# Patient Record
Sex: Male | Born: 1995 | Race: White | Hispanic: No | Marital: Single | Smoking: Current every day smoker
Health system: Southern US, Community
[De-identification: ages and names within clinical notes are randomized; demographics above are authoritative.]

## PROBLEM LIST (undated history)

## (undated) ENCOUNTER — Inpatient Hospital Stay: Admission: EM | Payer: Self-pay | Source: Home / Self Care

## (undated) DIAGNOSIS — M419 Scoliosis, unspecified: Secondary | ICD-10-CM

---
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
## 2016-04-10 ENCOUNTER — Emergency Department (HOSPITAL_COMMUNITY)
Admission: EM | Admit: 2016-04-10 | Discharge: 2016-04-10 | Disposition: A | Discharge status: Home / Self Care | Payer: Medicaid Other | Attending: Emergency Medicine | Admitting: Emergency Medicine

## 2016-04-10 ENCOUNTER — Encounter (HOSPITAL_COMMUNITY): Payer: Self-pay | Admitting: Emergency Medicine

## 2016-04-10 ENCOUNTER — Emergency Department (HOSPITAL_COMMUNITY): Payer: Medicaid Other

## 2016-04-10 DIAGNOSIS — F17210 Nicotine dependence, cigarettes, uncomplicated: Secondary | ICD-10-CM | POA: Diagnosis not present

## 2016-04-10 DIAGNOSIS — R110 Nausea: Secondary | ICD-10-CM | POA: Insufficient documentation

## 2016-04-10 DIAGNOSIS — R1032 Left lower quadrant pain: Secondary | ICD-10-CM | POA: Diagnosis present

## 2016-04-10 DIAGNOSIS — R109 Unspecified abdominal pain: Secondary | ICD-10-CM

## 2016-04-10 LAB — COMPREHENSIVE METABOLIC PANEL
ALBUMIN: 4.2 g/dL (ref 3.5–5.0)
ALK PHOS: 73 U/L (ref 38–126)
ALT: 31 U/L (ref 17–63)
AST: 22 U/L (ref 15–41)
Anion gap: 5 (ref 5–15)
BILIRUBIN TOTAL: 0.5 mg/dL (ref 0.3–1.2)
BUN: 11 mg/dL (ref 6–20)
CALCIUM: 9 mg/dL (ref 8.9–10.3)
CO2: 25 mmol/L (ref 22–32)
Chloride: 109 mmol/L (ref 101–111)
Creatinine, Ser: 0.77 mg/dL (ref 0.61–1.24)
GFR calc Af Amer: >60 mL/min (ref >60)
GFR calc non Af Amer: >60 mL/min (ref >60)
GLUCOSE: 108 mg/dL — AB (ref 65–99)
Potassium: 4.1 mmol/L (ref 3.5–5.1)
SODIUM: 139 mmol/L (ref 135–145)
TOTAL PROTEIN: 7.1 g/dL (ref 6.5–8.1)

## 2016-04-10 LAB — CBC
HCT: 39.2 % (ref 39.0–52.0)
HEMOGLOBIN: 13.3 g/dL (ref 13.0–17.0)
MCH: 27.6 pg (ref 26.0–34.0)
MCHC: 33.9 g/dL (ref 30.0–36.0)
MCV: 81.3 fL (ref 78.0–100.0)
Platelets: 249 10*3/uL (ref 150–400)
RBC: 4.82 MIL/uL (ref 4.22–5.81)
RDW: 12.5 % (ref 11.5–15.5)
WBC: 9.3 10*3/uL (ref 4.0–10.5)

## 2016-04-10 LAB — URINALYSIS, ROUTINE W REFLEX MICROSCOPIC
BILIRUBIN URINE: NEGATIVE
Glucose, UA: NEGATIVE mg/dL
KETONES UR: NEGATIVE mg/dL
Leukocytes, UA: NEGATIVE
NITRITE: NEGATIVE
PH: 6 (ref 5.0–8.0)
Protein, ur: NEGATIVE mg/dL

## 2016-04-10 LAB — URINE MICROSCOPIC-ADD ON
Bacteria, UA: NONE SEEN
SQUAMOUS EPITHELIAL / LPF: NONE SEEN
WBC UA: NONE SEEN WBC/hpf (ref 0–5)

## 2016-04-10 LAB — LIPASE, BLOOD: Lipase: 29 U/L (ref 11–51)

## 2016-04-10 MED ORDER — PANTOPRAZOLE SODIUM 20 MG PO TBEC: 20.0000 mg | DELAYED_RELEASE_TABLET | Freq: Every day | ORAL | Status: DC

## 2016-04-10 MED ORDER — RANITIDINE HCL 150 MG PO CAPS: 150.0000 mg | ORAL_CAPSULE | Freq: Two times a day (BID) | ORAL | Status: DC

## 2016-04-10 MED ORDER — IOPAMIDOL (ISOVUE-300) INJECTION 61%
100.0000 mL | Freq: Once | INTRAVENOUS | Status: AC | PRN
  Administered 2016-04-10: 100 mL via INTRAVENOUS

## 2016-04-10 NOTE — Discharge Instructions (Signed)
Your lab test are negative for acute problems. Your urine test suggests that there is mild dehydration present. Your CT scan is negative for any acute abdominal or pelvis problems. Your CT scan does suggest that she may have some early bulging of some disc in your lower back. Please discuss this with your primary physician. Please see Dr.Rourk, or a member of his team for GI evaluation of your lower abdomen pain. Please use Protonix and Zantac daily until seen by the GI specialist. Abdominal Pain, Adult Many things can cause abdominal pain. Usually, abdominal pain is not caused by a disease and will improve without treatment. It can often be observed and treated at home. Your health care provider will do a physical exam and possibly order blood tests and X-rays to help determine the seriousness of your pain. However, in many cases, more time must pass before a clear cause of the pain can be found. Before that point, your health care provider may not know if you need more testing or further treatment. HOME CARE INSTRUCTIONS Monitor your abdominal pain for any changes. The following actions may help to alleviate any discomfort you are experiencing:  Only take over-the-counter or prescription medicines as directed by your health care provider.  Do not take laxatives unless directed to do so by your health care provider.  Try a clear liquid diet (broth, tea, or water) as directed by your health care provider. Slowly move to a bland diet as tolerated. SEEK MEDICAL CARE IF:  You have unexplained abdominal pain.  You have abdominal pain associated with nausea or diarrhea.  You have pain when you urinate or have a bowel movement.  You experience abdominal pain that wakes you in the night.  You have abdominal pain that is worsened or improved by eating food.  You have abdominal pain that is worsened with eating fatty foods.  You have a fever. SEEK IMMEDIATE MEDICAL CARE IF:  Your pain does not go  away within 2 hours.  You keep throwing up (vomiting).  Your pain is felt only in portions of the abdomen, such as the right side or the left lower portion of the abdomen.  You pass bloody or black tarry stools. MAKE SURE YOU:  Understand these instructions.  Will watch your condition.  Will get help right away if you are not doing well or get worse.   This information is not intended to replace advice given to you by your health care provider. Make sure you discuss any questions you have with your health care provider.   Document Released: 06/27/2005 Document Revised: 06/08/2015 Document Reviewed: 05/27/2013 Elsevier Interactive Patient Education [employer]2016 [employer]

## 2016-04-10 NOTE — ED Notes (Signed)
When entered in room, found pt in cabinet, pt pulled an alcohol pad to clean site from lab draw, no dsg to site from lab draw as well

## 2016-04-10 NOTE — ED Notes (Signed)
Pt reports LLQ pain without n/v/d or urinary symptoms x 1 week.

## 2016-04-10 NOTE — ED Provider Notes (Signed)
CSN: [id number]     Arrival date & time 04/10/16  1311 History   First MD Initiated Contact with Patient 04/10/16 1448     Chief Complaint  Patient presents with  . Abdominal Pain     (Consider location/radiation/quality/duration/timing/severity/associated sxs/prior Treatment) Patient is a 20 y.o. male presenting with abdominal pain. The history is provided by the patient. No language interpreter was used.  Abdominal Pain Pain location:  LLQ Pain quality: aching   Pain radiates to:  Does not radiate Onset quality:  Gradual Duration:  1 week Timing:  Constant Progression:  Worsening Chronicity:  New Context: not recent travel   Relieved by:  Nothing Worsened by:  Nothing tried Ineffective treatments:  None tried Associated symptoms: nausea   Associated symptoms: no vomiting   Risk factors: no alcohol abuse and has not had multiple surgeries     Past Medical History  Diagnosis Date  . Scoliosis    History reviewed. No pertinent past surgical history. No family history on file. Social History  Substance Use Topics  . Smoking status: Current Every Day Smoker -- 0.50 packs/day    Types: Cigarettes  . Smokeless tobacco: None  . Alcohol Use: No    Review of Systems  Gastrointestinal: Positive for nausea and abdominal pain. Negative for vomiting.  All other systems reviewed and are negative.     Allergies  Review of patient's allergies indicates no known allergies.  Home Medications   Prior to Admission medications   Not on File   BP 136/73 mmHg  Pulse 93  Temp(Src) 98.1 F (36.7 C) (Oral)  Resp 16  Ht  (1.88 m)  Wt 113.399 kg  BMI 32.08 kg/m2  SpO2 99% Physical Exam  Constitutional: He is oriented to person, place, and time. He appears well-developed and well-nourished.  HENT:  Head: Normocephalic.  Eyes: EOM are normal.  Neck: Normal range of motion.  Cardiovascular: Normal rate and normal heart sounds.   Pulmonary/Chest: Effort normal.   Abdominal: He exhibits no distension.  Musculoskeletal: Normal range of motion.  Neurological: He is alert and oriented to person, place, and time.  Psychiatric: He has a normal mood and affect.  Nursing note and vitals reviewed.   ED Course  Procedures (including critical care time) Labs Review Labs Reviewed  COMPREHENSIVE METABOLIC PANEL - Abnormal; Notable for the following:    Glucose, Bld 108 (*)    All other components within normal limits  URINALYSIS, ROUTINE W REFLEX MICROSCOPIC (NOT AT [facility]) - Abnormal; Notable for the following:    Specific Gravity, Urine >1.030 (*)    Hgb urine dipstick TRACE (*)    All other components within normal limits  LIPASE, BLOOD  CBC  URINE MICROSCOPIC-ADD ON    Imaging Review Ct Abdomen Pelvis W Contrast  04/10/2016  CLINICAL DATA:  Left lower quadrant abdominal pain for 1 week. EXAM: CT ABDOMEN AND PELVIS WITH CONTRAST TECHNIQUE: Multidetector CT imaging of the abdomen and pelvis was performed using the standard protocol following bolus administration of intravenous contrast. CONTRAST:  ISOVUE-300 IOPAMIDOL (ISOVUE-300) INJECTION 61% COMPARISON:  None. FINDINGS: Lower chest: The lung bases are clear of acute process. No pleural effusion or pulmonary lesions. The heart is normal in size. No pericardial effusion. The distal esophagus and aorta are unremarkable. Hepatobiliary: No focal hepatic lesions or intrahepatic biliary dilatation. The gallbladder is normal. No common bile duct dilatation. Pancreas: No mass, inflammation or ductal dilatation. Spleen: Normal size.  No focal lesions. Adrenals/Urinary Tract: The  adrenal glands and kidneys are normal. No renal, ureteral or bladder calculi or mass. Stomach/Bowel: The stomach, duodenum, small bowel and colon are unremarkable. No inflammatory changes, mass lesions or obstructive findings. The terminal ileum is normal. The appendix is normal. Vascular/Lymphatic: The aorta is normal in caliber. The  branch vessels are normal. The major venous structures are patent. Small scattered mesenteric and retroperitoneal lymph nodes but no mass or overt adenopathy. Mesenteric adenitis is likely. Other: The bladder, prostate gland and seminal vesicles are unremarkable. No pelvic mass or adenopathy. No free pelvic fluid collections. No inguinal mass, adenopathy or hernia. Musculoskeletal: Thoracolumbar scoliosis. Bilateral pars defects at L5 with grade 1 spondylolisthesis. Moderate-sized bulging disc at L4-5. No acute bony findings. IMPRESSION: 1. No acute abdominal/pelvic findings, mass lesions or lymphadenopathy. 2. Numerous small mesenteric lymph nodes may suggest mesenteric adenitis. 3. Thoracolumbar scoliosis. 4. Bilateral pars defects at L5 with a grade 1 spondylolisthesis and diffuse bulging slightly uncovered disc at L4-5. Electronically Signed   By: Rudie MeyerP.  Gallerani M.D.   On: 04/10/2016 17:12   I have personally reviewed and evaluated these images and lab results as part of my medical decision-making.   EKG Interpretation None      MDM   Final diagnoses:  Abdominal pain, unspecified abdominal location    An After Visit Summary was printed and given to the patient.    Lonia SkinnerLeslie K ClontarfSofia, PA-C 04/11/16 1011  Lavera Guiseana Duo Liu, MD 04/11/16 [phone]

## 2016-04-10 NOTE — ED Provider Notes (Signed)
Patient care being continued from [name], P.A.-C.  Patient ambulatory from CT without problem.  Vital signs stable.  I reviewed the lab findings with the patient in terms which he understands. I reviewed the CT findings with the patient. The CT is negative for any acute abdomen changes. The CT does show some mild bulging disc at the L4-L5, and also some mild scoliosis. Patient's questions were answered. The patient will be treated with Protonix and Zantac for now. Patient referred to Dr.Rourk, or a member of his GI team. He will return to the emergency department sooner if any changes, problems, or concerns. Patient is in agreement with this discharge plan.  Stanley QualeHobson Sieanna Vanstone, PA-C 04/10/16 1741  Stanley HutchingBrian Cook, MD 04/11/16 [phone]

## 2016-12-02 ENCOUNTER — Encounter (HOSPITAL_COMMUNITY): Payer: Self-pay | Admitting: Emergency Medicine

## 2016-12-02 ENCOUNTER — Emergency Department (HOSPITAL_COMMUNITY)
Admission: EM | Admit: 2016-12-02 | Discharge: 2016-12-02 | Disposition: A | Discharge status: Home / Self Care | Payer: Medicaid Other | Attending: Emergency Medicine | Admitting: Emergency Medicine

## 2016-12-02 DIAGNOSIS — F17210 Nicotine dependence, cigarettes, uncomplicated: Secondary | ICD-10-CM | POA: Insufficient documentation

## 2016-12-02 DIAGNOSIS — Z79899 Other long term (current) drug therapy: Secondary | ICD-10-CM | POA: Insufficient documentation

## 2016-12-02 DIAGNOSIS — R21 Rash and other nonspecific skin eruption: Secondary | ICD-10-CM

## 2016-12-02 MED ORDER — PREDNISONE 50 MG PO TABS
60.0000 mg | ORAL_TABLET | Freq: Once | ORAL | Status: AC
  Administered 2016-12-02: 60 mg via ORAL
  Filled 2016-12-02: qty 1

## 2016-12-02 MED ORDER — DIPHENHYDRAMINE HCL 25 MG PO CAPS
25.0000 mg | ORAL_CAPSULE | Freq: Once | ORAL | Status: AC
  Administered 2016-12-02: 25 mg via ORAL
  Filled 2016-12-02: qty 1

## 2016-12-02 MED ORDER — TRIAMCINOLONE ACETONIDE 0.1 % EX CREA: 1.0000 "application " | TOPICAL_CREAM | Freq: Two times a day (BID) | CUTANEOUS | 0 refills | Status: DC

## 2016-12-02 MED ORDER — PREDNISONE 10 MG PO TABS
ORAL_TABLET | ORAL | 0 refills | Status: DC
Start: 2016-12-02 — End: 2017-09-26

## 2016-12-02 NOTE — ED Triage Notes (Signed)
Itchy rash in between fingers, arms legs and stomach x 3 weeks

## 2016-12-02 NOTE — ED Triage Notes (Signed)
Pt c/o rash to his hands, arms, chest, back and legs x 3 weeks.

## 2016-12-02 NOTE — Discharge Instructions (Signed)
Take your next dose of prednisone tomorrow evening.  Use benadryl as discussed if needed for itch relief.

## 2016-12-02 NOTE — ED Provider Notes (Signed)
[facility]-EMERGENCY DEPT Provider Note   CSN: [id number] Arrival date & time: 12/02/16  2007 By signing my name below, I, Levon Hedger, attest that this documentation has been prepared under the direction and in the presence of non-physician practitioner, Burgess Amor, PA-C. Electronically Signed: Levon Hedger, Scribe. 12/02/2016. 8:56 PM.   History   Chief Complaint Chief Complaint  Patient presents with  . Rash   HPI Stanley Sanchez is a 21 y.o. male with no pertinent PMHx who presents to the Emergency Department complaining of a gradually worsening, moderately itchy, diffuse rash to his bilateral hands, arms, legs and stomach onset three weeks ago. Per pt, the rash began on his stomach and has since spread to his upper and lower extremities. No alleviating or modifying factors noted. No OTC treatments tried PTA. No new soaps, lotions, detergents, foods, animals, plants, or medications. Per pt, no one at home has a similar rash. No hx of eczema. He denies any pain or any rash to scalp, neck or face.   PCP: Trinna Post, MD  The history is provided by the patient. No language interpreter was used.   Past Medical History:  Diagnosis Date  . Scoliosis    There are no active problems to display for this patient.  History reviewed. No pertinent surgical history.   Home Medications    Prior to Admission medications   Medication Sig Start Date End Date Taking? Authorizing Provider  pantoprazole (PROTONIX) 20 MG tablet Take 1 tablet (20 mg total) by mouth daily. 04/10/16   Ivery Quale, PA-C  predniSONE (DELTASONE) 10 MG tablet Take 6 tablets day one, 5 tablets day two, 4 tablets day three, 3 tablets day four, 2 tablets day five, then 1 tablet day six 12/02/16   Burgess Amor, PA-C  ranitidine (ZANTAC) 150 MG capsule Take 1 capsule (150 mg total) by mouth 2 (two) times daily. 04/10/16   Ivery Quale, PA-C  triamcinolone cream (KENALOG) 0.1 % Apply 1 application topically 2 (two) times  daily. Apply bid to rash on hands. 12/02/16   Burgess Amor, PA-C    Family History History reviewed. No pertinent family history.  Social History Social History  Substance Use Topics  . Smoking status: Current Every Day Smoker    Packs/day: 0.50    Types: Cigarettes  . Smokeless tobacco: Never Used  . Alcohol use No    Allergies   Patient has no known allergies.  Review of Systems Review of Systems  Constitutional: Negative for fever.  Musculoskeletal: Negative for myalgias.  Skin: Positive for rash.   Physical Exam Updated Vital Signs BP 126/71 (BP Location: Right Arm)   Pulse 98   Temp 98.8 F (37.1 C) (Oral)   Resp 18   Ht 6\' 2"  (1.88 m)   Wt 117.9 kg   SpO2 99%   BMI 33.38 kg/m   Physical Exam  Constitutional: He is oriented to person, place, and time. He appears well-developed and well-nourished. No distress.  HENT:  Head: Normocephalic and atraumatic.  Eyes: Conjunctivae are normal.  Cardiovascular: Normal rate.   Pulmonary/Chest: Effort normal.  Abdominal: He exhibits no distension.  Neurological: He is alert and oriented to person, place, and time.  Skin: Skin is warm and dry. Rash noted. Rash is papular.  Very small papules that are most confluent on the dorsal hands, more scattered on his forearms with one small patch on his abdomen. Surrounding skin is slightly hyperkeratotic. No erythema or drainage. No tunneling.  Psychiatric: He has  a normal mood and affect.  Nursing note and vitals reviewed.  ED Treatments / Results  DIAGNOSTIC STUDIES:  Oxygen Saturation is 99% on RA, normal by my interpretation.    COORDINATION OF CARE:  8:51 PM Discussed treatment plan which includes Prednisone with pt at bedside and pt agreed to plan.   Labs (all labs ordered are listed, but only abnormal results are displayed) Labs Reviewed - No data to display  EKG  EKG Interpretation None       Radiology No results found.  Procedures Procedures (including  critical care time)  Medications Ordered in ED Medications  predniSONE (DELTASONE) tablet 60 mg (60 mg Oral Given 12/02/16 2105)  diphenhydrAMINE (BENADRYL) capsule 25 mg (25 mg Oral Given 12/02/16 2105)     Initial Impression / Assessment and Plan / ED Course  I have reviewed the triage vital signs and the nursing notes.   Pertinent labs & imaging results that were available during my care of the patient were reviewed by me and considered in my medical decision making (see chart for details).     Nonspecific rash, but favoring contact dermatitis.  No tunneling, doubt scabies.  Prednisone, benadryl, f/u with pcp for a recheck if not improving over the next week.  Final Clinical Impressions(s) / ED Diagnoses   Final diagnoses:  Rash and nonspecific skin eruption    New Prescriptions New Prescriptions   PREDNISONE (DELTASONE) 10 MG TABLET    Take 6 tablets day one, 5 tablets day two, 4 tablets day three, 3 tablets day four, 2 tablets day five, then 1 tablet day six   TRIAMCINOLONE CREAM (KENALOG) 0.1 %    Apply 1 application topically 2 (two) times daily. Apply bid to rash on hands.  I personally performed the services described in this documentation, which was scribed in my presence. The recorded information has been reviewed and is accurate.    Burgess AmorJulie Nekayla Heider, PA-C 12/02/16 2112    Samuel JesterKathleen McManus, DO 12/04/16 1455

## 2017-09-26 ENCOUNTER — Emergency Department (HOSPITAL_COMMUNITY)
Admission: EM | Admit: 2017-09-26 | Discharge: 2017-09-26 | Disposition: A | Discharge status: Home / Self Care | Payer: BLUE CROSS/BLUE SHIELD | Attending: Emergency Medicine | Admitting: Emergency Medicine

## 2017-09-26 ENCOUNTER — Other Ambulatory Visit: Payer: Self-pay

## 2017-09-26 ENCOUNTER — Encounter (HOSPITAL_COMMUNITY): Payer: Self-pay

## 2017-09-26 DIAGNOSIS — F17210 Nicotine dependence, cigarettes, uncomplicated: Secondary | ICD-10-CM | POA: Diagnosis not present

## 2017-09-26 DIAGNOSIS — B2790 Infectious mononucleosis, unspecified without complication: Secondary | ICD-10-CM | POA: Diagnosis not present

## 2017-09-26 DIAGNOSIS — J029 Acute pharyngitis, unspecified: Secondary | ICD-10-CM | POA: Diagnosis not present

## 2017-09-26 LAB — I-STAT CHEM 8, ED
BUN: 11 mg/dL (ref 6–20)
CALCIUM ION: 1.07 mmol/L — AB (ref 1.15–1.40)
CHLORIDE: 99 mmol/L — AB (ref 101–111)
Creatinine, Ser: 1 mg/dL (ref 0.61–1.24)
Glucose, Bld: 121 mg/dL — ABNORMAL HIGH (ref 65–99)
HCT: 36 % — ABNORMAL LOW (ref 39.0–52.0)
Hemoglobin: 12.2 g/dL — ABNORMAL LOW (ref 13.0–17.0)
POTASSIUM: 4.2 mmol/L (ref 3.5–5.1)
SODIUM: 135 mmol/L (ref 135–145)
TCO2: 25 mmol/L (ref 22–32)

## 2017-09-26 LAB — RAPID STREP SCREEN (MED CTR MEBANE ONLY): Streptococcus, Group A Screen (Direct): NEGATIVE

## 2017-09-26 LAB — MONONUCLEOSIS SCREEN: Mono Screen: POSITIVE — AB

## 2017-09-26 MED ORDER — MAGIC MOUTHWASH W/LIDOCAINE: 5.0000 mL | Freq: Three times a day (TID) | ORAL | 0 refills | Status: AC | PRN

## 2017-09-26 MED ORDER — HYDROCODONE-ACETAMINOPHEN 7.5-325 MG/15ML PO SOLN: 10.0000 mL | Freq: Four times a day (QID) | ORAL | 0 refills | Status: AC | PRN

## 2017-09-26 MED ORDER — KETOROLAC TROMETHAMINE 60 MG/2ML IM SOLN
60.0000 mg | Freq: Once | INTRAMUSCULAR | Status: AC
  Administered 2017-09-26: 60 mg via INTRAMUSCULAR
  Filled 2017-09-26: qty 2

## 2017-09-26 MED ORDER — HYDROCODONE-ACETAMINOPHEN 7.5-325 MG/15ML PO SOLN
10.0000 mL | Freq: Once | ORAL | Status: AC
  Administered 2017-09-26: 10 mL via ORAL
  Filled 2017-09-26: qty 15

## 2017-09-26 MED ORDER — IBUPROFEN 100 MG/5ML PO SUSP: 600.0000 mg | Freq: Four times a day (QID) | ORAL | 0 refills | Status: AC | PRN

## 2017-09-26 NOTE — ED Notes (Signed)
Patient given discharge instruction, verbalized understand. Patient ambulatory out of the department.  

## 2017-09-26 NOTE — Discharge Instructions (Signed)
It is very important that you drink plenty of fluids.  Soft foods as tolerated.  Avoid letting anyone eat or drink after you as this is contagious.  You can contact 1 of the providers listed to arrange follow-up appointment in 1-2 weeks.  Return to the ER for any worsening symptoms.  Avoid any strenuous activity or physical contact that could possibly injury your spleen for at least 2-3 weeks.

## 2017-09-26 NOTE — ED Triage Notes (Signed)
Reports of sore throat x3 days. Denies fever.

## 2017-09-27 NOTE — ED Provider Notes (Signed)
[facility] EMERGENCY DEPARTMENT Provider Note   CSN: [id number] Arrival date & time: 09/26/17  [id number]     History   Chief Complaint Chief Complaint  Patient presents with  . Sore Throat    HPI Luana ShuJonathan D Grecco is a 21 y.o. male.  HPI  Luana ShuJonathan D Favero is a 21 y.o. male who presents to the Emergency Department complaining of sore throat and pain with swallowing.  Symptoms have been present for 3 days.  States he has been unable to eat solid food or drink liquids due to throat pain.  Denies fever, but symptoms are associated with generalized malaise and body aches.  No known sick contacts.  He denies significant nasal congestion, cough, shortness of breath, abdominal pain, rash and vomiting.  He has been taking OTC pain relievers without improvement.    Past Medical History:  Diagnosis Date  . Scoliosis     There are no active problems to display for this patient.   History reviewed. No pertinent surgical history.     Home Medications    Prior to Admission medications   Medication Sig Start Date End Date Taking? Authorizing Provider  HYDROcodone-acetaminophen (HYCET) 7.5-325 mg/15 ml solution Take 10 mLs by mouth every 6 (six) hours as needed for moderate pain. 09/26/17   Estephan Gallardo, PA-C  ibuprofen (ADVIL,MOTRIN) 100 MG/5ML suspension Take 30 mLs (600 mg total) by mouth every 6 (six) hours as needed. 09/26/17   Tarrance Januszewski, PA-C  magic mouthwash w/lidocaine SOLN Take 5 mLs by mouth 3 (three) times daily as needed for mouth pain. Swish and spit, do not swallow 09/26/17   Bonne Whack, PA-C    Family History No family history on file.  Social History Social History   Tobacco Use  . Smoking status: Current Every Day Smoker    Packs/day: 0.50    Types: Cigarettes  . Smokeless tobacco: Never Used  Substance Use Topics  . Alcohol use: No  . Drug use: No     Allergies   Patient has no known allergies.   Review of Systems Review of Systems    Constitutional: Positive for fatigue. Negative for activity change, appetite change, chills and fever.  HENT: Positive for sore throat and trouble swallowing. Negative for congestion, ear pain, facial swelling and voice change.   Eyes: Negative for pain and visual disturbance.  Respiratory: Negative for cough and shortness of breath.   Gastrointestinal: Negative for abdominal pain, nausea and vomiting.  Genitourinary: Negative for dysuria and flank pain.  Musculoskeletal: Positive for myalgias. Negative for arthralgias, neck pain and neck stiffness.  Skin: Negative for color change and rash.  Neurological: Negative for dizziness, facial asymmetry, speech difficulty, numbness and headaches.  Hematological: Negative for adenopathy.  All other systems reviewed and are negative.    Physical Exam Updated Vital Signs BP 136/68   Pulse 60   Temp 98.1 F (36.7 C) (Oral)   Resp 17   Ht 6\' 2"  (1.88 m)   Wt 113.4 kg (250 lb)   SpO2 (!) 87%   BMI 32.10 kg/m   Physical Exam  Constitutional: He is oriented to person, place, and time. He appears well-developed and well-nourished. No distress.  HENT:  Head: Normocephalic and atraumatic.  Right Ear: Tympanic membrane and ear canal normal.  Left Ear: Tympanic membrane and ear canal normal.  Mouth/Throat: Uvula is midline and mucous membranes are normal. No trismus in the jaw. No uvula swelling. Posterior oropharyngeal edema and posterior oropharyngeal erythema present. No  tonsillar abscesses. Tonsils are 2+ on the right. Tonsils are 2+ on the left. Tonsillar exudate.  Neck: Normal range of motion. Neck supple.  Cardiovascular: Normal rate, regular rhythm and normal heart sounds.  No murmur heard. Pulmonary/Chest: Effort normal and breath sounds normal. No respiratory distress.  Abdominal: Soft. Normal appearance. He exhibits no distension. There is no splenomegaly. There is no tenderness.  Musculoskeletal: Normal range of motion.   Lymphadenopathy:    He has no cervical adenopathy.  Neurological: He is alert and oriented to person, place, and time. He exhibits normal muscle tone. Coordination normal.  Skin: Skin is warm and dry.  Nursing note and vitals reviewed.    ED Treatments / Results  Labs (all labs ordered are listed, but only abnormal results are displayed) Labs Reviewed  MONONUCLEOSIS SCREEN - Abnormal; Notable for the following components:      Result Value   Mono Screen POSITIVE (*)    All other components within normal limits  I-STAT CHEM 8, ED - Abnormal; Notable for the following components:   Chloride 99 (*)    Glucose, Bld 121 (*)    Calcium, Ion 1.07 (*)    Hemoglobin 12.2 (*)    HCT 36.0 (*)    All other components within normal limits  RAPID STREP SCREEN (NOT AT [facility])  CULTURE, GROUP A STREP [facility]    EKG  EKG Interpretation None       Radiology No results found.  Procedures Procedures (including critical care time)  Medications Ordered in ED Medications  HYDROcodone-acetaminophen (HYCET) 7.5-325 mg/15 ml solution 10 mL (10 mLs Oral Given 09/26/17 0916)  ketorolac (TORADOL) injection 60 mg (60 mg Intramuscular Given 09/26/17 1056)     Initial Impression / Assessment and Plan / ED Course  I have reviewed the triage vital signs and the nursing notes.  Pertinent labs & imaging results that were available during my care of the patient were reviewed by me and considered in my medical decision making (see chart for details).     Airway patent.  Bilateral tonsillar edema.  No concerning sx's for PTA. No clinical signs of dehydration.  Reports feeling better after medications.   Pt tolerating oral fluids.  Positive mono spot.  Pt counseled on activity precautions.  Agrees to supportive care and close PCP f/u.    Final Clinical Impressions(s) / ED Diagnoses   Final diagnoses:  Mononucleosis    ED Discharge Orders        Ordered    ibuprofen (ADVIL,MOTRIN) 100 MG/5ML  suspension  Every 6 hours PRN     09/26/17 1128    magic mouthwash w/lidocaine SOLN  3 times daily PRN     09/26/17 1128    HYDROcodone-acetaminophen (HYCET) 7.5-325 mg/15 ml solution  Every 6 hours PRN     09/26/17 1128       Pauline Ausriplett, Deryn Massengale, PA-C 09/27/17 1400    Mesner, Barbara CowerJason, MD 09/27/17 1703

## 2017-09-28 LAB — CULTURE, GROUP A STREP (THRC)

## 2018-01-26 IMAGING — CT CT ABD-PELV W/ CM
2 of 4 series · 15 of 46 positions shown, 17 images · IV contrast (iopamidol)
Comparison: None.

CLINICAL DATA: Left lower quadrant abdominal pain for 1 week.

EXAM:
CT ABDOMEN AND PELVIS WITH CONTRAST
TECHNIQUE: Multidetector CT imaging of the abdomen and pelvis was performed
using the standard protocol following bolus administration of
intravenous contrast.
CONTRAST:  100mL DASBBI-0II IOPAMIDOL (DASBBI-0II) INJECTION 61%

[Series 2: routine abd pel with · axial · 0.67mm/px · z∈[+570,+1020]mm · 12 of 108 slices shown, 14 images]
[im 9/108  soft-tissue]
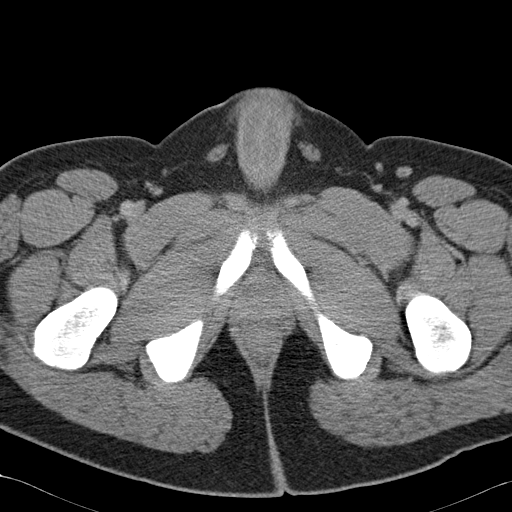
[im 9/108  bone]
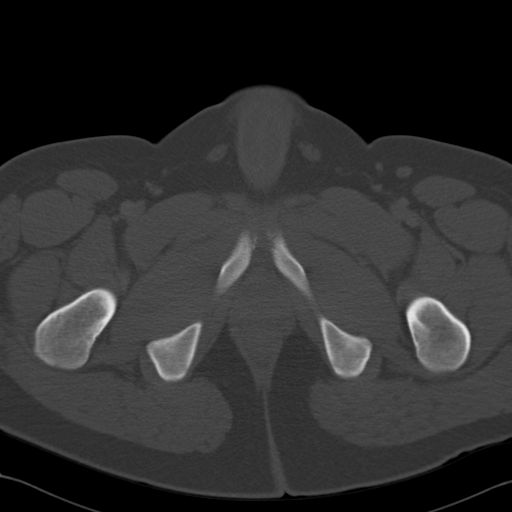
[im 17/108  soft-tissue]
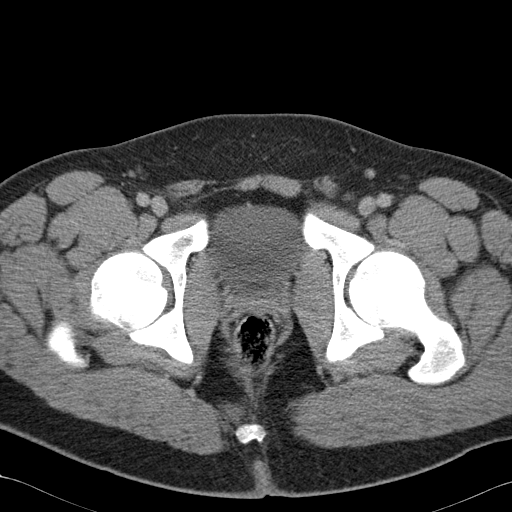
[im 25/108  soft-tissue]
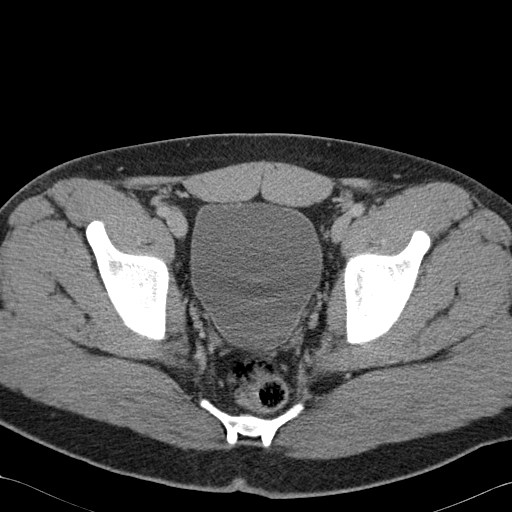
[im 33/108  soft-tissue]
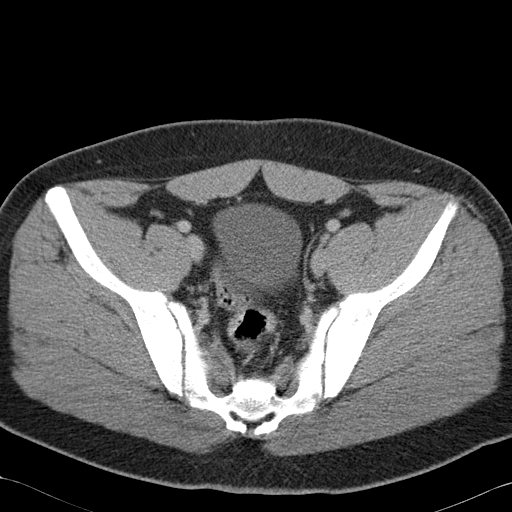
[im 42/108  soft-tissue]
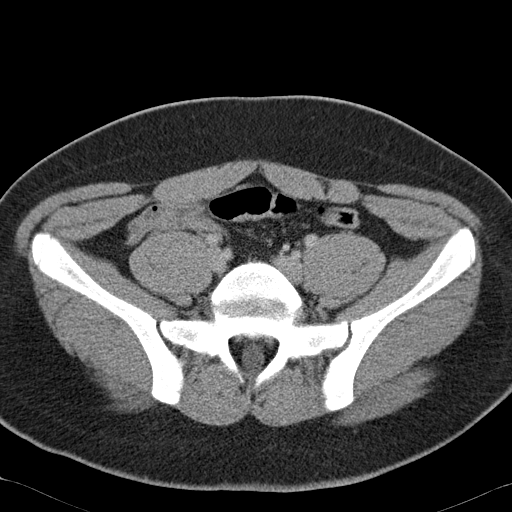
[im 50/108  soft-tissue]
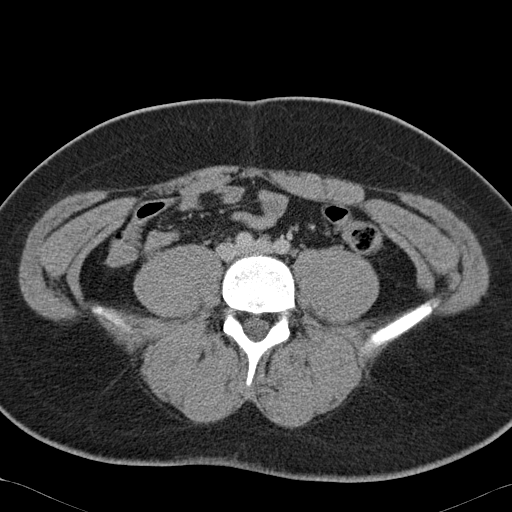
[im 58/108  soft-tissue]
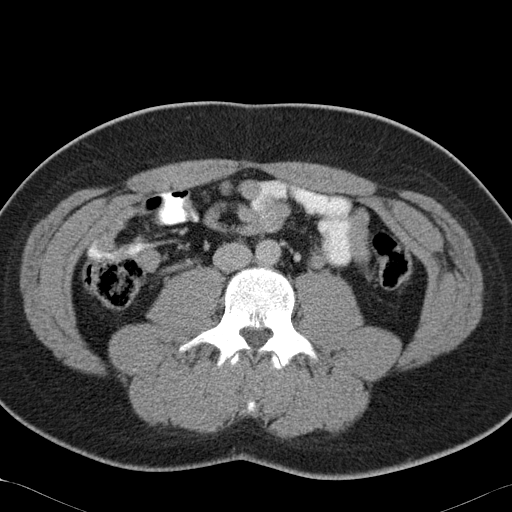
[im 66/108  soft-tissue]
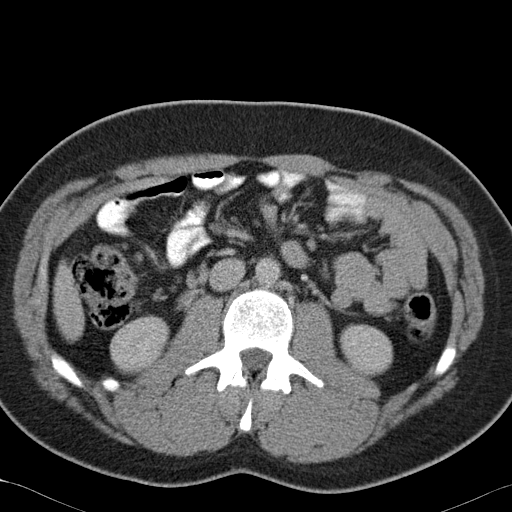
[im 75/108  soft-tissue]
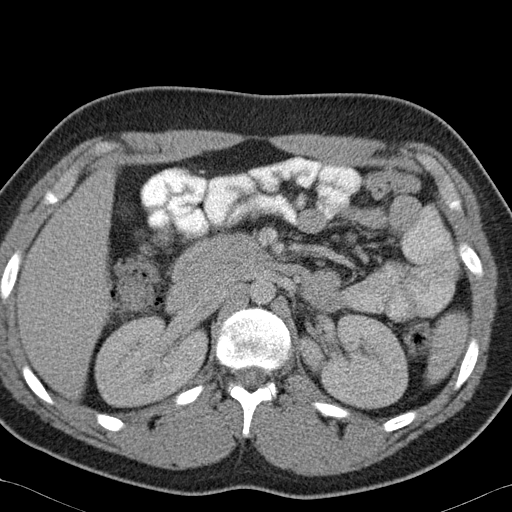
[im 75/108  bone]
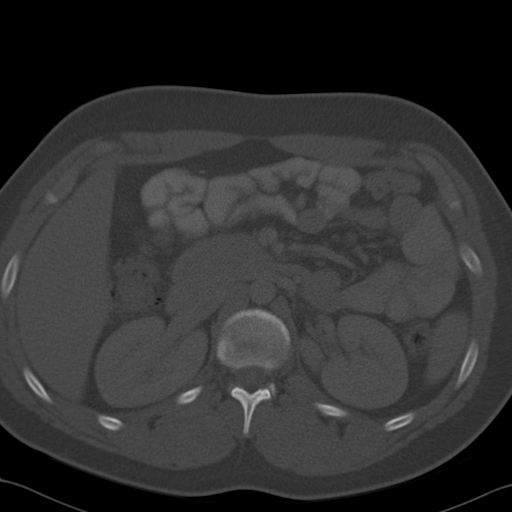
[im 83/108  soft-tissue]
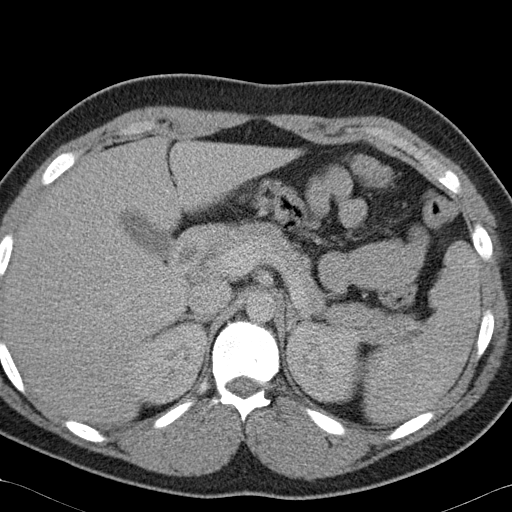
[im 91/108  soft-tissue]
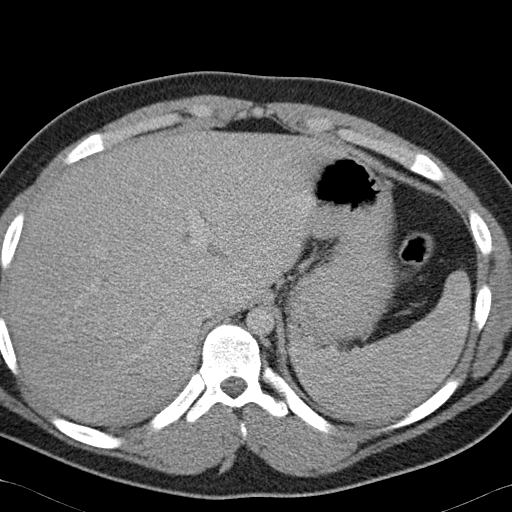
[im 99/108  soft-tissue]
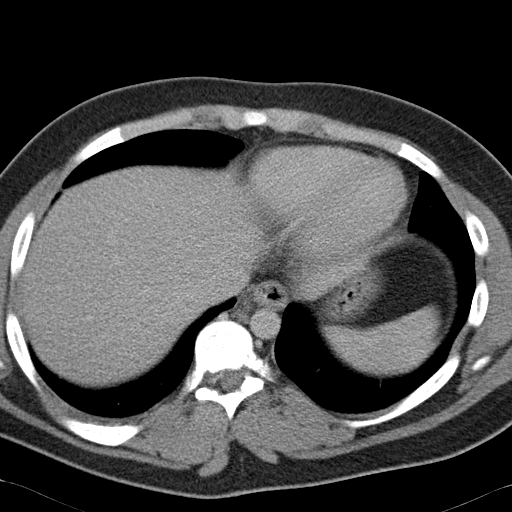

[Series 3: coronal · coronal · 0.72mm/px · 3 of 140 slices shown]
[im 47/140  soft-tissue]
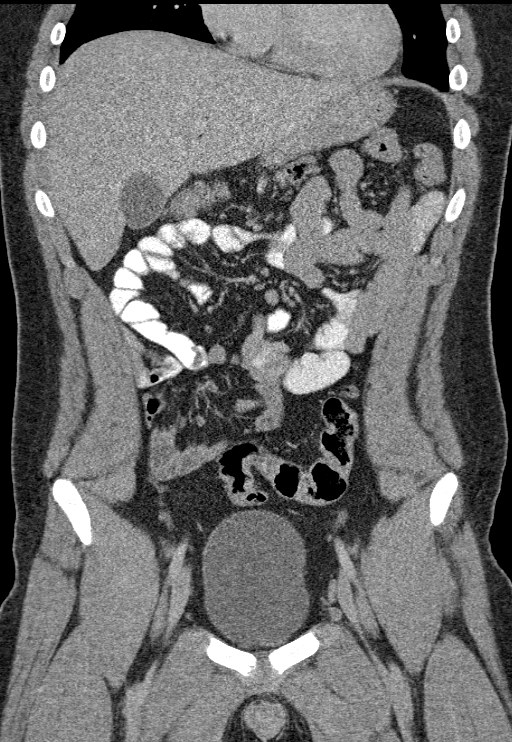
[im 62/140  soft-tissue]
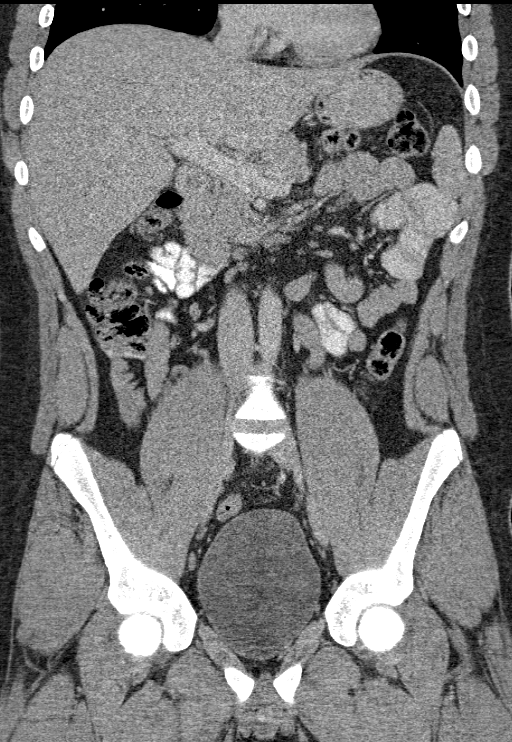
[im 78/140  soft-tissue]
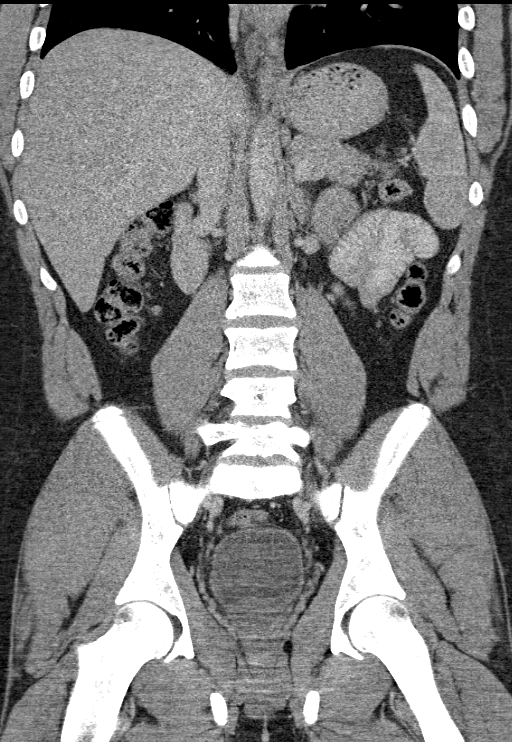

[15 of 46 positions shown; findings below may reference images not displayed]

FINDINGS: Lower chest: The lung bases are clear of acute process. No pleural
effusion or pulmonary lesions. The heart is normal in size. No
pericardial effusion. The distal esophagus and aorta are
unremarkable.

Hepatobiliary: No focal hepatic lesions or intrahepatic biliary
dilatation. The gallbladder is normal. No common bile duct
dilatation.

Pancreas: No mass, inflammation or ductal dilatation.

Spleen: Normal size.  No focal lesions.

Adrenals/Urinary Tract: The adrenal glands and kidneys are normal.
No renal, ureteral or bladder calculi or mass.

Stomach/Bowel: The stomach, duodenum, small bowel and colon are
unremarkable. No inflammatory changes, mass lesions or obstructive
findings. The terminal ileum is normal. The appendix is normal.

Vascular/Lymphatic: The aorta is normal in caliber. The branch
vessels are normal. The major venous structures are patent. Small
scattered mesenteric and retroperitoneal lymph nodes but no mass or
overt adenopathy. Mesenteric adenitis is likely.

Other: The bladder, prostate gland and seminal vesicles are
unremarkable. No pelvic mass or adenopathy. No free pelvic fluid
collections. No inguinal mass, adenopathy or hernia.

Musculoskeletal: Thoracolumbar scoliosis. Bilateral pars defects at
L5 with grade 1 spondylolisthesis. Moderate-sized bulging disc at
L4-5. No acute bony findings.
IMPRESSION: 1. No acute abdominal/pelvic findings, mass lesions or
lymphadenopathy.
2. Numerous small mesenteric lymph nodes may suggest mesenteric
adenitis.
3. Thoracolumbar scoliosis.
4. Bilateral pars defects at L5 with a grade 1 spondylolisthesis and
diffuse bulging slightly uncovered disc at L4-5.

## 2018-09-16 DIAGNOSIS — Z6830 Body mass index (BMI) 30.0-30.9, adult: Secondary | ICD-10-CM | POA: Diagnosis not present

## 2018-09-16 DIAGNOSIS — Z0000 Encounter for general adult medical examination without abnormal findings: Secondary | ICD-10-CM | POA: Diagnosis not present

## 2018-09-16 DIAGNOSIS — Z1389 Encounter for screening for other disorder: Secondary | ICD-10-CM | POA: Diagnosis not present

## 2018-09-16 DIAGNOSIS — E6609 Other obesity due to excess calories: Secondary | ICD-10-CM | POA: Diagnosis not present

## 2018-09-17 DIAGNOSIS — Z0000 Encounter for general adult medical examination without abnormal findings: Secondary | ICD-10-CM | POA: Diagnosis not present

## 2018-09-17 DIAGNOSIS — Z1389 Encounter for screening for other disorder: Secondary | ICD-10-CM | POA: Diagnosis not present

## 2019-05-01 ENCOUNTER — Other Ambulatory Visit: Payer: Self-pay

## 2019-05-01 ENCOUNTER — Other Ambulatory Visit: Payer: BLUE CROSS/BLUE SHIELD

## 2019-05-01 DIAGNOSIS — Z20822 Contact with and (suspected) exposure to covid-19: Secondary | ICD-10-CM

## 2019-05-03 ENCOUNTER — Telehealth: Payer: Self-pay | Admitting: Family Medicine

## 2019-05-03 LAB — NOVEL CORONAVIRUS, NAA: SARS-CoV-2, NAA: DETECTED — AB

## 2019-05-03 NOTE — Telephone Encounter (Signed)
I was informed by [facility] today that his recent test for the Covid -19 virus was positive. I spoke to him by phone and he said he felt fine with no symptoms at all. He decided to get tested because several coworkers at [employer] where he works have recently tested positive. I advised him to self quarantine for 14 days, and he agreed. He will set up a virtual visit with his PCP, Dr. Ethlyn Gallery this week to follow up and to discuss any documentation he will need for work.

## 2019-05-04 NOTE — Telephone Encounter (Signed)
Noted  

## 2019-05-20 DIAGNOSIS — E6609 Other obesity due to excess calories: Secondary | ICD-10-CM | POA: Diagnosis not present

## 2019-05-20 DIAGNOSIS — Z6828 Body mass index (BMI) 28.0-28.9, adult: Secondary | ICD-10-CM | POA: Diagnosis not present

## 2019-05-20 DIAGNOSIS — Z1389 Encounter for screening for other disorder: Secondary | ICD-10-CM | POA: Diagnosis not present

## 2019-05-20 DIAGNOSIS — U071 COVID-19: Secondary | ICD-10-CM | POA: Diagnosis not present

## 2019-05-20 DIAGNOSIS — R51 Headache: Secondary | ICD-10-CM | POA: Diagnosis not present

## 2021-03-30 DIAGNOSIS — E663 Overweight: Secondary | ICD-10-CM | POA: Diagnosis not present

## 2021-03-30 DIAGNOSIS — Z1389 Encounter for screening for other disorder: Secondary | ICD-10-CM | POA: Diagnosis not present

## 2021-03-30 DIAGNOSIS — Z1331 Encounter for screening for depression: Secondary | ICD-10-CM | POA: Diagnosis not present

## 2021-03-30 DIAGNOSIS — Z6829 Body mass index (BMI) 29.0-29.9, adult: Secondary | ICD-10-CM | POA: Diagnosis not present

## 2021-03-30 DIAGNOSIS — Z0000 Encounter for general adult medical examination without abnormal findings: Secondary | ICD-10-CM | POA: Diagnosis not present

## 2021-08-01 DEATH — deceased
